# Patient Record
Sex: Male | Born: 1970 | Race: Black or African American | Hispanic: No | Marital: Married | Smoking: Current some day smoker
Health system: Southern US, Community
[De-identification: ages and names within clinical notes are randomized; demographics above are authoritative.]

## PROBLEM LIST (undated history)

## (undated) DIAGNOSIS — J449 Chronic obstructive pulmonary disease, unspecified: Secondary | ICD-10-CM

---

## 2018-05-22 ENCOUNTER — Other Ambulatory Visit: Payer: Self-pay

## 2018-05-22 ENCOUNTER — Emergency Department (HOSPITAL_COMMUNITY): Payer: 59

## 2018-05-22 ENCOUNTER — Encounter (HOSPITAL_COMMUNITY): Payer: Self-pay

## 2018-05-22 ENCOUNTER — Emergency Department (HOSPITAL_COMMUNITY)
Admission: EM | Admit: 2018-05-22 | Discharge: 2018-05-22 | Disposition: A | Discharge status: Home / Self Care | Payer: 59 | Attending: Emergency Medicine | Admitting: Emergency Medicine

## 2018-05-22 DIAGNOSIS — R42 Dizziness and giddiness: Secondary | ICD-10-CM | POA: Diagnosis not present

## 2018-05-22 DIAGNOSIS — G43009 Migraine without aura, not intractable, without status migrainosus: Secondary | ICD-10-CM | POA: Diagnosis not present

## 2018-05-22 DIAGNOSIS — Z8673 Personal history of transient ischemic attack (TIA), and cerebral infarction without residual deficits: Secondary | ICD-10-CM | POA: Diagnosis not present

## 2018-05-22 DIAGNOSIS — J449 Chronic obstructive pulmonary disease, unspecified: Secondary | ICD-10-CM | POA: Insufficient documentation

## 2018-05-22 DIAGNOSIS — H5461 Unqualified visual loss, right eye, normal vision left eye: Secondary | ICD-10-CM

## 2018-05-22 DIAGNOSIS — F17290 Nicotine dependence, other tobacco product, uncomplicated: Secondary | ICD-10-CM | POA: Insufficient documentation

## 2018-05-22 HISTORY — DX: Chronic obstructive pulmonary disease, unspecified: J44.9

## 2018-05-22 LAB — COMPREHENSIVE METABOLIC PANEL WITH GFR
ALT: 13 U/L (ref 0–44)
AST: 25 U/L (ref 15–41)
Albumin: 3.3 g/dL — ABNORMAL LOW (ref 3.5–5.0)
Alkaline Phosphatase: 61 U/L (ref 38–126)
Anion gap: 7 (ref 5–15)
BUN: 12 mg/dL (ref 6–20)
CO2: 22 mmol/L (ref 22–32)
Calcium: 8.3 mg/dL — ABNORMAL LOW (ref 8.9–10.3)
Chloride: 113 mmol/L — ABNORMAL HIGH (ref 98–111)
Creatinine, Ser: 1.32 mg/dL — ABNORMAL HIGH (ref 0.61–1.24)
GFR calc Af Amer: >60 mL/min
GFR calc non Af Amer: >60 mL/min
Glucose, Bld: 98 mg/dL (ref 70–99)
Potassium: 3.9 mmol/L (ref 3.5–5.1)
Sodium: 142 mmol/L (ref 135–145)
Total Bilirubin: 0.6 mg/dL (ref 0.3–1.2)
Total Protein: 6.3 g/dL — ABNORMAL LOW (ref 6.5–8.1)

## 2018-05-22 LAB — PROTIME-INR
INR: 0.89
Prothrombin Time: 11.9 s (ref 11.4–15.2)

## 2018-05-22 LAB — CBC
HCT: 50.4 % (ref 39.0–52.0)
Hemoglobin: 16.4 g/dL (ref 13.0–17.0)
MCH: 28.6 pg (ref 26.0–34.0)
MCHC: 32.5 g/dL (ref 30.0–36.0)
MCV: 87.8 fL (ref 78.0–100.0)
Platelets: 223 10*3/uL (ref 150–400)
RBC: 5.74 MIL/uL (ref 4.22–5.81)
RDW: 16.3 % — ABNORMAL HIGH (ref 11.5–15.5)
WBC: 10.1 10*3/uL (ref 4.0–10.5)

## 2018-05-22 MED ORDER — PROCHLORPERAZINE EDISYLATE 10 MG/2ML IJ SOLN
10.0000 mg | Freq: Once | INTRAMUSCULAR | Status: AC
  Administered 2018-05-22: 10 mg via INTRAVENOUS
  Filled 2018-05-22: qty 2

## 2018-05-22 MED ORDER — DIPHENHYDRAMINE HCL 50 MG/ML IJ SOLN
25.0000 mg | Freq: Once | INTRAMUSCULAR | Status: AC
  Administered 2018-05-22: 25 mg via INTRAVENOUS
  Filled 2018-05-22: qty 1

## 2018-05-22 MED ORDER — SODIUM CHLORIDE 0.9 % IV BOLUS
1000.0000 mL | Freq: Once | INTRAVENOUS | Status: AC
  Administered 2018-05-22: 1000 mL via INTRAVENOUS

## 2018-05-22 NOTE — Discharge Instructions (Signed)
You were evaluated in the Emergency Department and after careful evaluation, we did not find any emergent condition requiring admission or further testing in the hospital.  Your symptoms today seem to be due to a complex migraine.  We evaluated your brain with an MRI, which was normal.  Please return to the Emergency Department if you experience any worsening of your condition.  We encourage you to follow up with a primary care provider.  Thank you for allowing [place] to be a part of your care.

## 2018-05-22 NOTE — ED Triage Notes (Signed)
Pt states he was at work and had sudden onset of painless vision loss. He states after a minute he was able to see out of the bottom of his eye. Pt reports his vision has now returned to baseline.

## 2018-05-22 NOTE — ED Provider Notes (Signed)
[facility] Emergency Department Provider Note MRN:  [id number]  Arrival date & time: 05/22/18     Chief Complaint   Vision loss History of Present Illness   Trevor Riley is a 47 y.o. year-old male with a history of COPD presenting to the ED with chief complaint of vision loss.  Patient was at work when at about 8 AM, experienced painless, sudden loss of vision of the right eye.  Tried the rub and blink the eye, but vision did not return.  Complete loss of vision to this eye for about 1 minute, followed by return of vision of only the lower portion of his visual field.  Quickly followed by return of his entire vision.  Briefly during this recovery, patient explains that his vision was distorted, described as looking through a kaleidoscope.  Vision loss associated with dull right-sided headache, mild in severity.  Denies any nausea or vomiting, no chest pain or shortness of breath, no trauma to the eye or the head recently.  No numbness or weakness of the arms or legs.  Denies bright lights in the peripheral vision, no floaters.  Currently vision is completely back to baseline.  Review of Systems  A complete 10 system review of systems was obtained and all systems are negative except as noted in the HPI and PMH.   Patient's Health History    Past Medical History:  Diagnosis Date  . COPD (chronic obstructive pulmonary disease) (HCC)      No family history on file.  Social History   Socioeconomic History  . Marital status: Married    Spouse name: Not on file  . Number of children: Not on file  . Years of education: Not on file  . Highest education level: Not on file  Occupational History  . Not on file  Social Needs  . Financial resource strain: Not on file  . Food insecurity:    Worry: Not on file    Inability: Not on file  . Transportation needs:    Medical: Not on file    Non-medical: Not on file  Tobacco Use  . Smoking status: Current Some Day Smoker   Types: Cigars  . Smokeless tobacco: Never Used  Substance and Sexual Activity  . Alcohol use: Yes    Comment: occ  . Drug use: Yes    Types: Marijuana  . Sexual activity: Not on file  Lifestyle  . Physical activity:    Days per week: Not on file    Minutes per session: Not on file  . Stress: Not on file  Relationships  . Social connections:    Talks on phone: Not on file    Gets together: Not on file    Attends religious service: Not on file    Active member of club or organization: Not on file    Attends meetings of clubs or organizations: Not on file    Relationship status: Not on file  . Intimate partner violence:    Fear of current or ex partner: Not on file    Emotionally abused: Not on file    Physically abused: Not on file    Forced sexual activity: Not on file  Other Topics Concern  . Not on file  Social History Narrative  . Not on file     Physical Exam  Vital Signs and Nursing Notes reviewed Vitals:   05/22/18 1338 05/22/18 1400  BP: 122/77 126/82  Pulse: 60 61  Resp: 16 16  Temp:    SpO2: 95% 100%    CONSTITUTIONAL: Well-appearing, NAD NEURO:  Alert and oriented x 3, no focal deficits EYES:  eyes equal and reactive ENT/NECK:  no LAD, no JVD CARDIO: Regular rate, well-perfused, normal S1 and S2 PULM:  CTAB no wheezing or rhonchi GI/GU:  normal bowel sounds, non-distended, non-tender MSK/SPINE:  No gross deformities, no edema SKIN:  no rash, atraumatic PSYCH:  Appropriate speech and behavior  Diagnostic and Interventional Summary    EKG Interpretation  Date/Time:    Ventricular Rate:    PR Interval:    QRS Duration:   QT Interval:    QTC Calculation:   R Axis:     Text Interpretation:        Labs Reviewed  CBC - Abnormal; Notable for the following components:      Result Value   RDW 16.3 (*)    All other components within normal limits  COMPREHENSIVE METABOLIC PANEL - Abnormal; Notable for the following components:   Chloride 113 (*)     Creatinine, Ser 1.32 (*)    Calcium 8.3 (*)    Total Protein 6.3 (*)    Albumin 3.3 (*)    All other components within normal limits  PROTIME-INR    MR BRAIN WO CONTRAST  Final Result      Medications  prochlorperazine (COMPAZINE) injection 10 mg (10 mg Intravenous Given 05/22/18 1345)  diphenhydrAMINE (BENADRYL) injection 25 mg (25 mg Intravenous Given 05/22/18 1340)  sodium chloride 0.9 % bolus 1,000 mL (0 mLs Intravenous Stopped 05/22/18 1510)     Procedures  EMERGENCY DEPARTMENT [place] OCULAR EXAM "Study: Limited Ultrasound of Orbit "  INDICATIONS: Vision loss Linear probe utilized to obtain images in both long and short axis of the orbit having the patient look left and right if possible.  PERFORMED BY: Myself IMAGES ARCHIVED?: Yes LIMITATIONS: none VIEWS USED: Right orbit INTERPRETATION: No retinal detachment, Lens in proper position, Normal optic nerve diameter   Critical Care  ED Course and Medical Decision Making  I have reviewed the triage vital signs and the nursing notes.  Pertinent labs & imaging results that were available during my care of the patient were reviewed by me and considered in my medical decision making (see below for details). Clinical Course as of May 22 1629  Wed May 22, 2018  1403 Question of retinal detachment versus amaurosis fugax versus complex migraine in this 47 year old male who is otherwise healthy.  Brief total painless vision loss of the right eye.  Complete resolution, currently at baseline.  Bedside ultrasound with no evidence of retinal pathology.  Discussed with neurology, favoring migraine but without history of migraines will rule out ischemic process with MRI.   [MB]    Clinical Course User Index [MB] Sabas Sous, MD    MRI brain unremarkable.  Patient asymptomatic now after migraine cocktail.  Provided reassurance, will follow-up with PCP.  After the discussed management above, the patient was determined to be safe for  discharge.  The patient was in agreement with this plan and all questions regarding their care were answered.  ED return precautions were discussed and the patient will return to the ED with any significant worsening of condition.  Elmer Sow. Pilar Plate, MD [facility] Emergency Medicine [facility] [email or web address]  Final Clinical Impressions(s) / ED Diagnoses     ICD-10-CM   1. Vision loss of right eye H54.61   2. Migraine without aura and without status migrainosus, not  intractable G43.009     ED Discharge Orders    None         Jeiden, Daughtridge, MD 05/22/18 [phone]

## 2018-05-22 NOTE — ED Notes (Signed)
Patient transported to MRI 

## 2019-09-13 IMAGING — MR MR HEAD W/O CM
9 of 10 series · 36 of 48 positions shown · non-contrast
Comparison: None.

CLINICAL DATA: TIA.  Dizziness

EXAM:
MRI HEAD WITHOUT CONTRAST
TECHNIQUE: Multiplanar, multiecho pulse sequences of the brain and surrounding
structures were obtained without intravenous contrast.

[Series 3: DWI · axial · 3.0mm · 1.09mm/px · z∈[-52,+86]mm · 9 of 94 slices shown (1 of 4)]
[im 1/94]
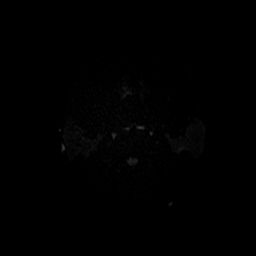
[im 12/94]
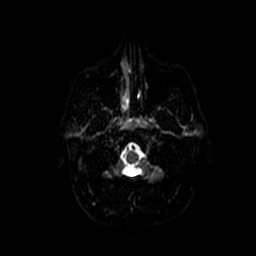
[im 24/94]
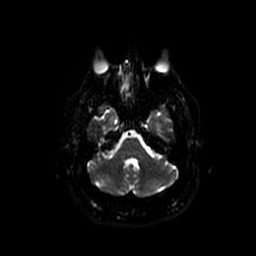
[im 35/94]
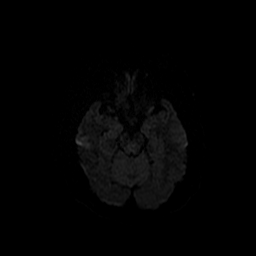
[im 47/94]
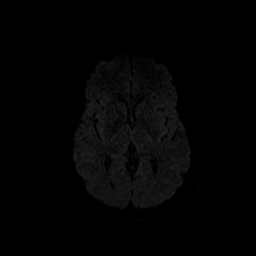
[im 59/94]
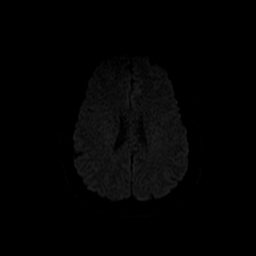
[im 70/94]
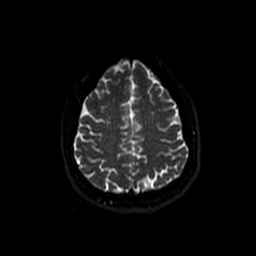
[im 82/94]
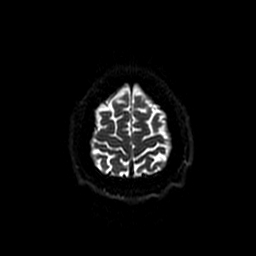
[im 94/94]
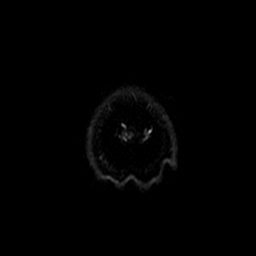

[Series 3: FLAIR · axial · 5.0mm · 0.43mm/px · z∈[-80,+74]mm · 3 of 27 slices shown]
[im 1/27]
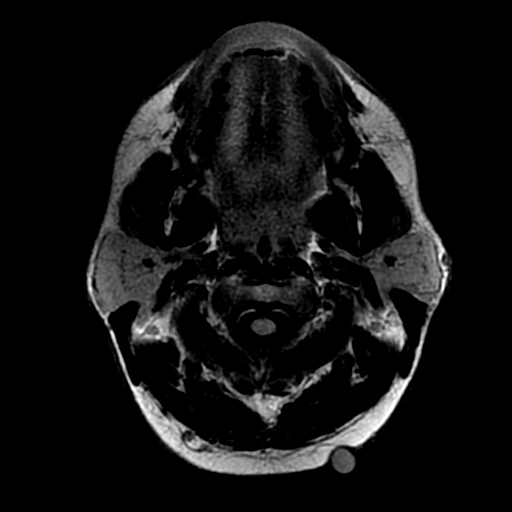
[im 14/27]
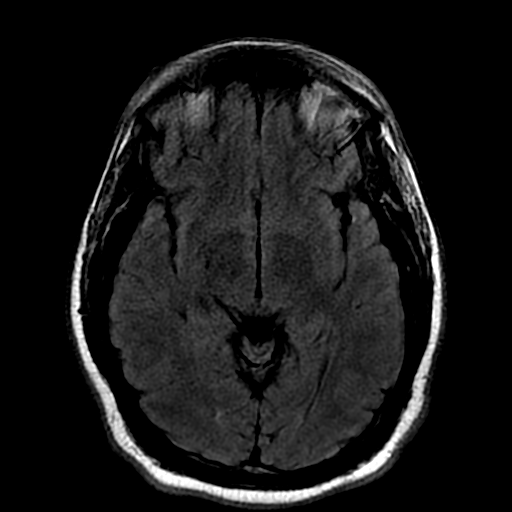
[im 27/27]
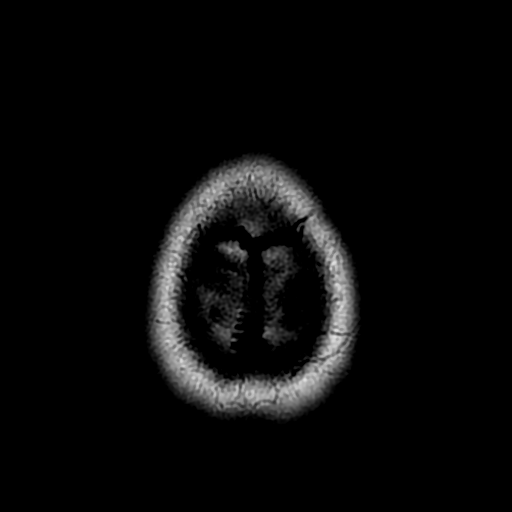

[Series 4: ax mpgr · axial · 5.0mm · 0.43mm/px · z∈[-80,-3]mm · 2 of 27 slices shown]
[im 1/27]
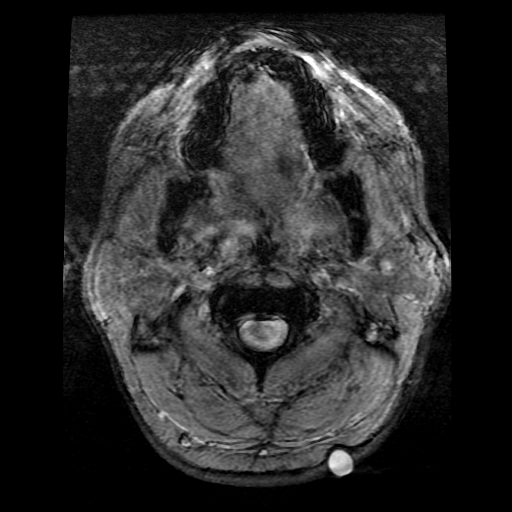
[im 14/27]
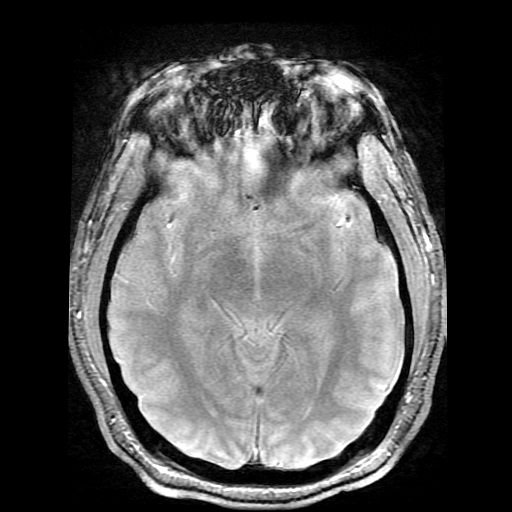

[Series 4: DWI · coronal · 5.0mm · 1.09mm/px · 7 of 66 slices shown (2 of 4)]
[im 1/66]
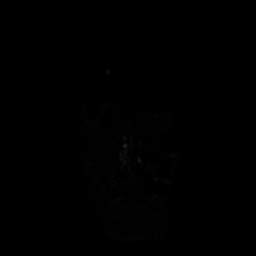
[im 11/66]
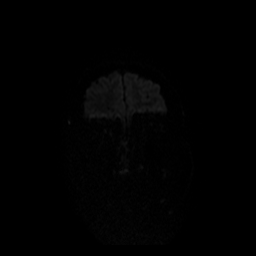
[im 22/66]
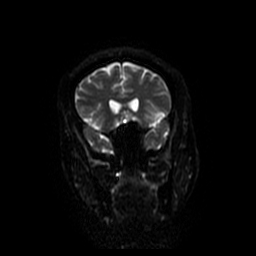
[im 33/66]
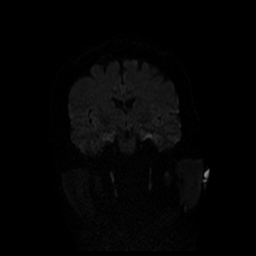
[im 44/66]
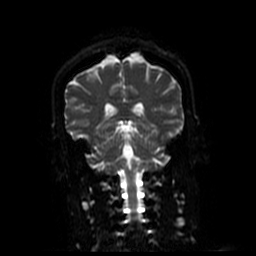
[im 55/66]
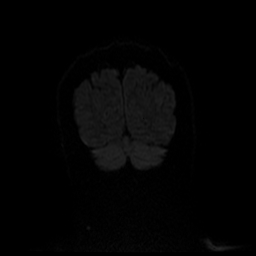
[im 66/66]
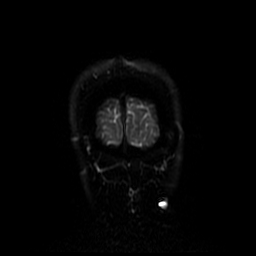

[Series 5: T1 · sagittal · 5.0mm · 0.47mm/px · 2 of 23 slices shown]
[im 1/23]
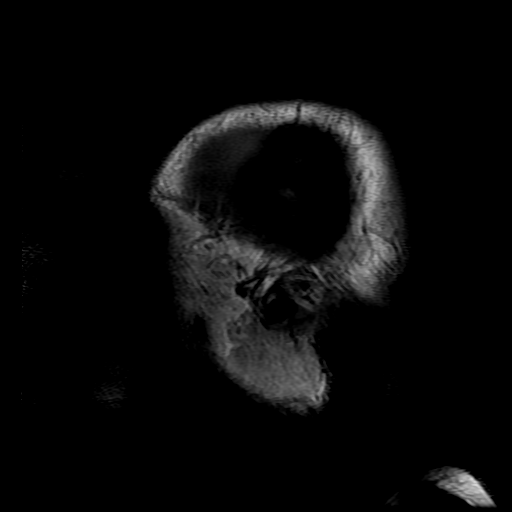
[im 23/23]
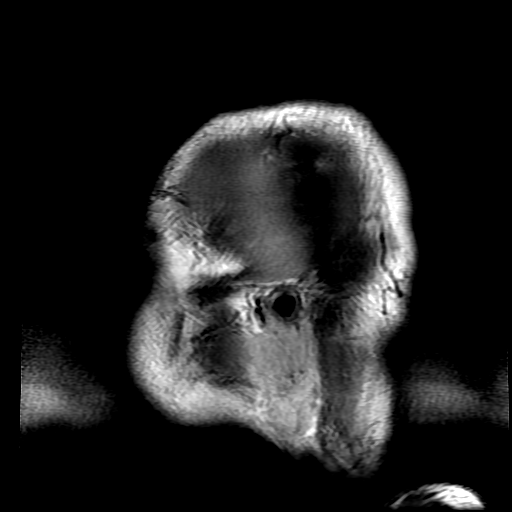

[Series 6: T2 · axial · 5.0mm · 0.43mm/px · z∈[-48,+89]mm · 2 of 24 slices shown (1 of 2)]
[im 1/24]
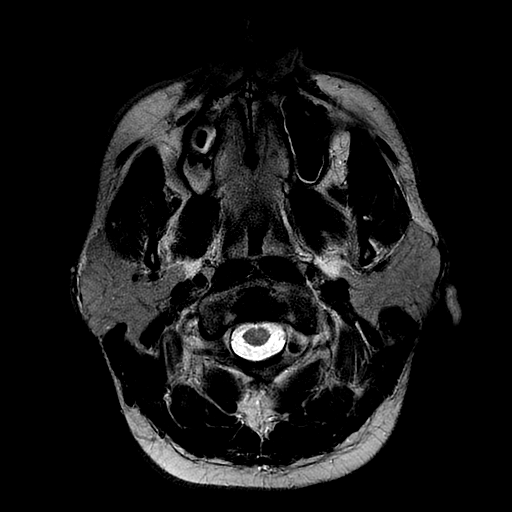
[im 24/24]
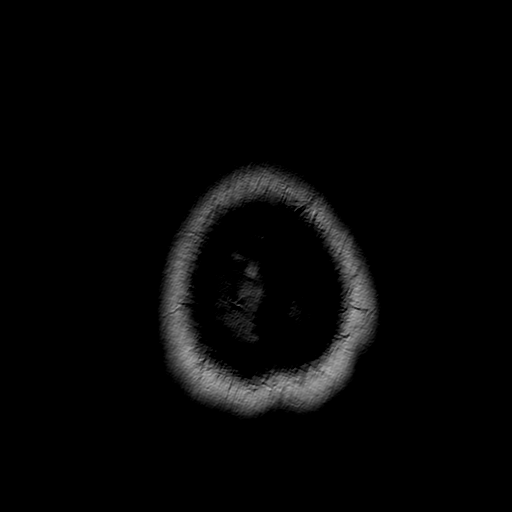

[Series 6: T2 · coronal · 5.0mm · 0.43mm/px · 3 of 31 slices shown (2 of 2)]
[im 1/31]
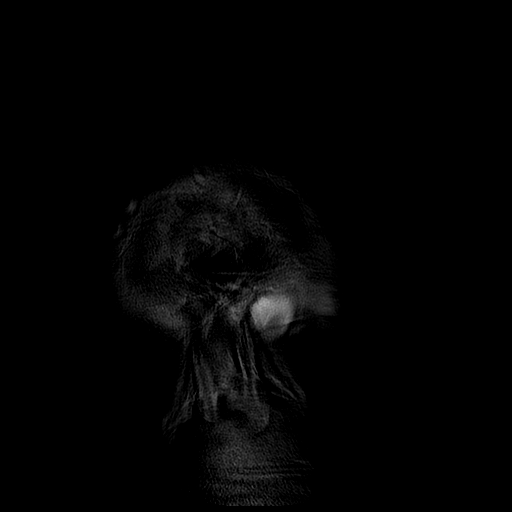
[im 16/31]
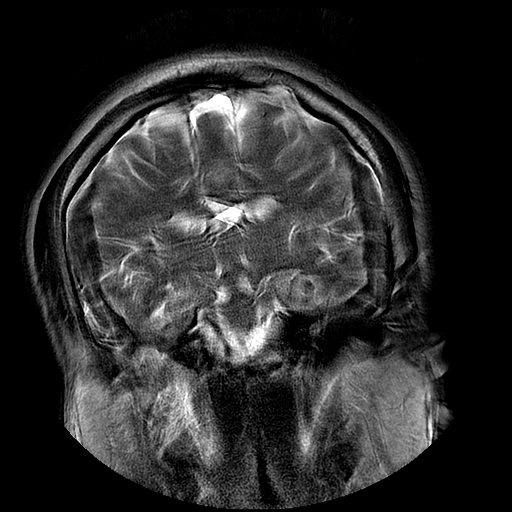
[im 31/31]
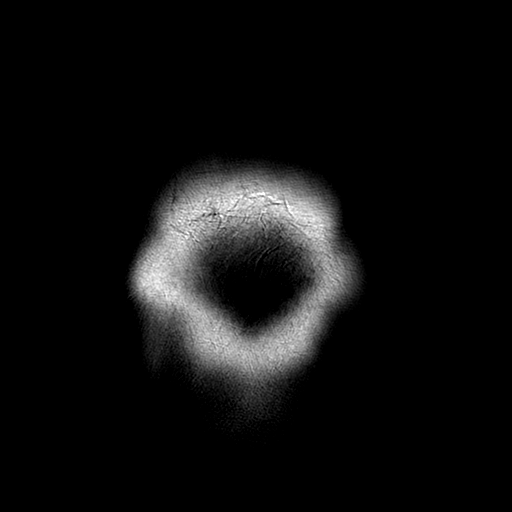

[Series 300: DWI · axial · 3.0mm · 1.09mm/px · z∈[-52,+86]mm · 5 of 47 slices shown (3 of 4)]
[im 1/47]
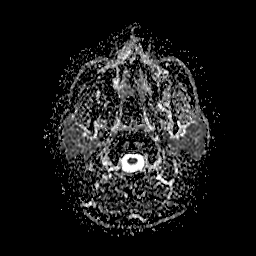
[im 12/47]
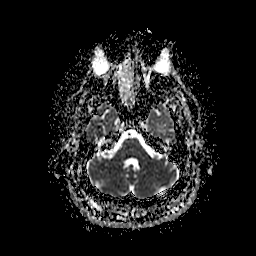
[im 24/47]
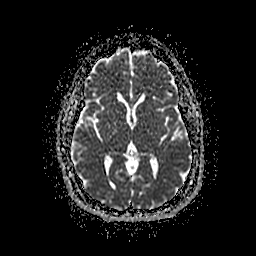
[im 35/47]
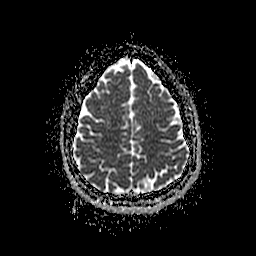
[im 47/47]
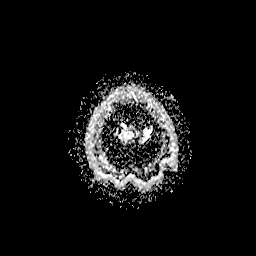

[Series 400: DWI · coronal · 5.0mm · 1.09mm/px · 3 of 33 slices shown (4 of 4)]
[im 1/33]
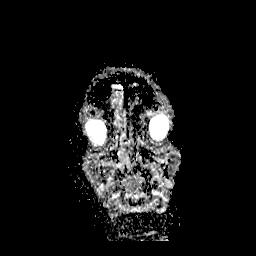
[im 17/33]
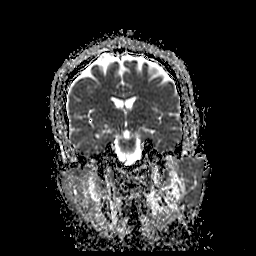
[im 33/33]
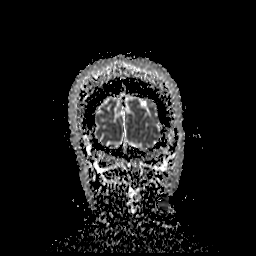

[36 of 48 positions shown; findings below may reference images not displayed]

FINDINGS: Brain: No acute infarction, hemorrhage, hydrocephalus, extra-axial
collection or mass lesion.

Image quality degraded by motion

Vascular: Normal arterial flow voids

Skull and upper cervical spine: Negative

Sinuses/Orbits: Mild mucosal edema paranasal sinuses.  Normal orbit

Other: None
IMPRESSION: Image degraded study.  No abnormality identified in the brain

Mucosal edema in the paranasal sinuses.
